# Patient Record
Sex: Female | Born: 1980 | Race: White | Hispanic: No | Marital: Married | Smoking: Former smoker
Health system: Southern US, Community
[De-identification: ages and names within clinical notes are randomized; demographics above are authoritative.]

## PROBLEM LIST (undated history)

## (undated) DIAGNOSIS — I82409 Acute embolism and thrombosis of unspecified deep veins of unspecified lower extremity: Secondary | ICD-10-CM

## (undated) HISTORY — DX: Acute embolism and thrombosis of unspecified deep veins of unspecified lower extremity: I82.409

---

## 2002-11-12 ENCOUNTER — Encounter: Payer: Self-pay | Admitting: Family Medicine

## 2002-11-12 ENCOUNTER — Inpatient Hospital Stay (HOSPITAL_COMMUNITY): Admission: EM | Admit: 2002-11-12 | Discharge: 2002-11-19 | Payer: Self-pay | Admitting: Family Medicine

## 2002-11-12 ENCOUNTER — Encounter: Admission: RE | Admit: 2002-11-12 | Discharge: 2002-11-12 | Payer: Self-pay | Admitting: Family Medicine

## 2002-11-13 ENCOUNTER — Encounter: Payer: Self-pay | Admitting: Family Medicine

## 2003-05-09 ENCOUNTER — Ambulatory Visit (HOSPITAL_COMMUNITY): Admission: RE | Admit: 2003-05-09 | Discharge: 2003-05-09 | Payer: Self-pay | Admitting: Internal Medicine

## 2003-07-01 ENCOUNTER — Ambulatory Visit (HOSPITAL_COMMUNITY): Admission: RE | Admit: 2003-07-01 | Discharge: 2003-07-01 | Payer: Self-pay | Admitting: Family Medicine

## 2009-04-17 HISTORY — PX: APPENDECTOMY: SHX54

## 2009-05-02 ENCOUNTER — Encounter (INDEPENDENT_AMBULATORY_CARE_PROVIDER_SITE_OTHER): Payer: Self-pay | Admitting: General Surgery

## 2009-05-02 ENCOUNTER — Inpatient Hospital Stay (HOSPITAL_COMMUNITY): Admission: EM | Admit: 2009-05-02 | Discharge: 2009-05-10 | Payer: Self-pay | Admitting: Emergency Medicine

## 2010-08-08 ENCOUNTER — Ambulatory Visit (INDEPENDENT_AMBULATORY_CARE_PROVIDER_SITE_OTHER): Payer: 59 | Admitting: Internal Medicine

## 2010-08-08 ENCOUNTER — Encounter: Payer: Self-pay | Admitting: Internal Medicine

## 2010-08-08 DIAGNOSIS — R51 Headache: Secondary | ICD-10-CM | POA: Insufficient documentation

## 2010-08-08 DIAGNOSIS — L659 Nonscarring hair loss, unspecified: Secondary | ICD-10-CM | POA: Insufficient documentation

## 2010-08-08 DIAGNOSIS — R519 Headache, unspecified: Secondary | ICD-10-CM | POA: Insufficient documentation

## 2010-08-08 DIAGNOSIS — R5383 Other fatigue: Secondary | ICD-10-CM

## 2010-08-08 DIAGNOSIS — R5381 Other malaise: Secondary | ICD-10-CM

## 2010-08-08 DIAGNOSIS — R946 Abnormal results of thyroid function studies: Secondary | ICD-10-CM | POA: Insufficient documentation

## 2010-08-08 DIAGNOSIS — G5600 Carpal tunnel syndrome, unspecified upper limb: Secondary | ICD-10-CM | POA: Insufficient documentation

## 2010-08-08 DIAGNOSIS — I82409 Acute embolism and thrombosis of unspecified deep veins of unspecified lower extremity: Secondary | ICD-10-CM

## 2010-08-08 LAB — BASIC METABOLIC PANEL
BUN: 15 mg/dL (ref 6–23)
CO2: 27 mEq/L (ref 19–32)
Calcium: 9.6 mg/dL (ref 8.4–10.5)
Chloride: 106 mEq/L (ref 96–112)
Creatinine, Ser: 0.7 mg/dL (ref 0.4–1.2)
GFR: 108.95 mL/min (ref >60.00)
Potassium: 4.8 mEq/L (ref 3.5–5.1)
Sodium: 142 mEq/L (ref 135–145)

## 2010-08-08 LAB — CONVERTED CEMR LAB
Anti Nuclear Antibody(ANA): NEGATIVE
Vit D, 25-Hydroxy: 39 ng/mL (ref 30–89)

## 2010-08-08 LAB — CBC WITH DIFFERENTIAL/PLATELET
Basophils Absolute: 0 10*3/uL (ref 0.0–0.1)
Basophils Relative: 0.6 % (ref 0.0–3.0)
Eosinophils Absolute: 0.1 10*3/uL (ref 0.0–0.7)
Eosinophils Relative: 1.7 % (ref 0.0–5.0)
Hemoglobin: 13 g/dL (ref 12.0–15.0)
Lymphocytes Relative: 26.3 % (ref 12.0–46.0)
MCHC: 33.9 g/dL (ref 30.0–36.0)
MCV: 91.1 fl (ref 78.0–100.0)
Monocytes Absolute: 0.4 10*3/uL (ref 0.1–1.0)
Neutro Abs: 5.4 10*3/uL (ref 1.4–7.7)
Neutrophils Relative %: 66.5 % (ref 43.0–77.0)
Platelets: 332 10*3/uL (ref 150.0–400.0)
RBC: 4.21 Mil/uL (ref 3.87–5.11)
RDW: 13.4 % (ref 11.5–14.6)

## 2010-08-08 LAB — HEPATIC FUNCTION PANEL
ALT: 14 U/L (ref 0–35)
AST: 18 U/L (ref 0–37)
Albumin: 4.2 g/dL (ref 3.5–5.2)
Alkaline Phosphatase: 55 U/L (ref 39–117)
Bilirubin, Direct: 0.1 mg/dL (ref 0.0–0.3)
Total Bilirubin: 0.6 mg/dL (ref 0.3–1.2)

## 2010-08-08 LAB — VITAMIN B12: Vitamin B-12: 550 pg/mL (ref 211–911)

## 2010-08-08 LAB — TSH: TSH: 1.3 u[IU]/mL (ref 0.35–5.50)

## 2010-08-08 NOTE — Progress Notes (Signed)
  Subjective:    Patient ID: [name], female    DOB: 09/02/1980, 30 y.o.   MRN: [id number]  HPI Pt presents to clinic for evaluation of possible abnormal thyroid tests. Notes several year h/o constellation of sx's including fatigue, cold intolerance, alopecia, dry skin, scalp irritation, irritability and difficulty losing wt. Was evaluated by her gyn who obtained tsh, free t4, t3, ft3 and prolactin. All values nl except low free t4 at 0.82. Pt concerned about possible hypothyroidism.   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 No further thrombotic events. Was taking ocp at the time without tobacco use. No known family h/o clots.  C/o right lateral fingers intermittent numbness and wrist pain without h/o trauma. Uses computer mouse frequently with right hand.  Reviewed PMH, PSH, medications, allergies, social hx and family hx.    Review of Systems  Constitutional: Positive for fatigue. Negative for fever, chills, activity change and unexpected weight change.  HENT: Negative for ear pain, facial swelling and neck pain.   Eyes: Negative for discharge, redness and visual disturbance.  Respiratory: Negative for cough, shortness of breath and wheezing.   Cardiovascular: Negative for chest pain and palpitations.  Gastrointestinal: Positive for constipation. Negative for nausea, abdominal pain and abdominal distention.  Genitourinary: Negative for hematuria, decreased urine volume and difficulty urinating.  Musculoskeletal: Positive for arthralgias. Negative for joint swelling.  Skin: Negative for color change and rash.  Neurological: Positive for numbness. Negative for dizziness, syncope and headaches.  Hematological: Negative for adenopathy. Does not bruise/bleed easily.  Psychiatric/Behavioral: Negative for behavioral problems, confusion and agitation.       Objective:   Physical Exam  [nursing  notereviewed. Constitutional: Vital signs are normal. She appears well-developed and well-nourished. No distress.  HENT:  Head: Normocephalic and atraumatic.  Right Ear: Tympanic membrane, external ear and ear canal normal.  Left Ear: Tympanic membrane, external ear and ear canal normal.  Nose: Nose normal.  Mouth/Throat: Oropharynx is clear and moist. No oropharyngeal exudate.  Eyes: EOM are normal. Pupils are equal, round, and reactive to light. Right eye exhibits no discharge. Left eye exhibits no discharge. No scleral icterus.  Neck: Normal range of motion. Neck supple. No thyromegaly present.  Cardiovascular: Normal rate, regular rhythm and normal heart sounds.  Exam reveals no gallop and no friction rub.   No murmur heard. Pulmonary/Chest: Effort normal and breath sounds normal. No respiratory distress. She has no wheezes. She has no rales.  Abdominal: Soft. Bowel sounds are normal. She exhibits no distension and no mass. There is no hepatosplenomegaly. There is no tenderness. There is no rebound and no guarding.  Musculoskeletal:       FROM right hand/wrist. +phalens right. No thenar muscle wasting  Lymphadenopathy:    She has no cervical adenopathy.  Neurological: She is alert. She displays no tremor. Gait normal.  Skin: Skin is warm. No rash noted. She is not diaphoretic. No erythema.  Psychiatric: She has a normal mood and affect. Her behavior is normal.          Assessment & Plan:

## 2010-08-08 NOTE — Assessment & Plan Note (Signed)
Re-evaluate thyroid. Obtain ANA.

## 2010-08-08 NOTE — Assessment & Plan Note (Signed)
Prescription provided for right wrist splint. Followup if no improvement or worsening.

## 2010-08-08 NOTE — Assessment & Plan Note (Signed)
Repeat TSH with free t4. Pt expresses interest in possible endocrinology consult pending these results

## 2010-08-08 NOTE — Assessment & Plan Note (Signed)
Repeat thyroid evaluation. Obtain cbc, chem7, lft, b12 and vitamin d.

## 2010-08-09 ENCOUNTER — Telehealth: Payer: Self-pay

## 2010-08-09 LAB — VITAMIN D 25 HYDROXY (VIT D DEFICIENCY, FRACTURES): Vit D, 25-Hydroxy: 39 ng/mL (ref 30–89)

## 2010-08-09 LAB — ANA: Anti Nuclear Antibody(ANA): NEGATIVE

## 2010-08-09 NOTE — Telephone Encounter (Signed)
Pt aware. Wants labs mailed to her before making decision on endocrinology consult. Letter sent

## 2010-08-09 NOTE — Telephone Encounter (Signed)
Message copied by Kyung Rudd on Thu Aug 09, 2010 12:55 PM ------      Message from: Letitia Libra, Maisie Fus      Created: Thu Aug 09, 2010 10:06 AM       Labs nl including both thyroid tests. Pt seemed to want endocrinology consult if labs nl. pls check when notify

## 2010-09-19 LAB — BASIC METABOLIC PANEL
BUN: 3 mg/dL — ABNORMAL LOW (ref 6–23)
CO2: 30 mEq/L (ref 19–32)
Chloride: 102 mEq/L (ref 96–112)
GFR calc non Af Amer: >60 mL/min (ref >60)
Glucose, Bld: 109 mg/dL — ABNORMAL HIGH (ref 70–99)
Glucose, Bld: 96 mg/dL (ref 70–99)
Potassium: 3.5 mEq/L (ref 3.5–5.1)
Potassium: 4.7 mEq/L (ref 3.5–5.1)
Sodium: 135 mEq/L (ref 135–145)

## 2010-09-19 LAB — CBC
HCT: 33.7 % — ABNORMAL LOW (ref 36.0–46.0)
HCT: 33.8 % — ABNORMAL LOW (ref 36.0–46.0)
HCT: 41.2 % (ref 36.0–46.0)
Hemoglobin: 11.3 g/dL — ABNORMAL LOW (ref 12.0–15.0)
Hemoglobin: 11.3 g/dL — ABNORMAL LOW (ref 12.0–15.0)
Hemoglobin: 13.8 g/dL (ref 12.0–15.0)
MCHC: 33.5 g/dL (ref 30.0–36.0)
MCHC: 33.5 g/dL (ref 30.0–36.0)
MCV: 89.7 fL (ref 78.0–100.0)
MCV: 89.8 fL (ref 78.0–100.0)
Platelets: 281 10*3/uL (ref 150–400)
Platelets: 287 10*3/uL (ref 150–400)
RBC: 3.76 MIL/uL — ABNORMAL LOW (ref 3.87–5.11)
RBC: 4.59 MIL/uL (ref 3.87–5.11)
RDW: 13.9 % (ref 11.5–15.5)
RDW: 14.6 % (ref 11.5–15.5)
WBC: 13.1 10*3/uL — ABNORMAL HIGH (ref 4.0–10.5)
WBC: 14.3 10*3/uL — ABNORMAL HIGH (ref 4.0–10.5)
WBC: 16.1 10*3/uL — ABNORMAL HIGH (ref 4.0–10.5)

## 2010-09-19 LAB — BODY FLUID CULTURE
Culture: NO GROWTH
Culture: NO GROWTH
Gram Stain: NONE SEEN

## 2010-09-19 LAB — DIFFERENTIAL
Basophils Absolute: 0 10*3/uL (ref 0.0–0.1)
Basophils Relative: 0 % (ref 0–1)
Eosinophils Absolute: 0 10*3/uL (ref 0.0–0.7)
Eosinophils Relative: 0 % (ref 0–5)
Lymphocytes Relative: 6 % — ABNORMAL LOW (ref 12–46)
Lymphs Abs: 1 10*3/uL (ref 0.7–4.0)
Monocytes Absolute: 0.9 10*3/uL (ref 0.1–1.0)
Monocytes Relative: 6 % (ref 3–12)
Neutro Abs: 14.1 10*3/uL — ABNORMAL HIGH (ref 1.7–7.7)
Neutrophils Relative %: 88 % — ABNORMAL HIGH (ref 43–77)

## 2010-09-19 LAB — COMPREHENSIVE METABOLIC PANEL
ALT: 14 U/L (ref 0–35)
ALT: 22 U/L (ref 0–35)
AST: 18 U/L (ref 0–37)
Albumin: 3.9 g/dL (ref 3.5–5.2)
Alkaline Phosphatase: 166 U/L — ABNORMAL HIGH (ref 39–117)
Alkaline Phosphatase: 56 U/L (ref 39–117)
BUN: 7 mg/dL (ref 6–23)
CO2: 28 mEq/L (ref 19–32)
CO2: 30 mEq/L (ref 19–32)
Calcium: 8.3 mg/dL — ABNORMAL LOW (ref 8.4–10.5)
Calcium: 9.5 mg/dL (ref 8.4–10.5)
Chloride: 105 mEq/L (ref 96–112)
Chloride: 98 mEq/L (ref 96–112)
Creatinine, Ser: 0.86 mg/dL (ref 0.4–1.2)
GFR calc Af Amer: >60 mL/min (ref >60)
GFR calc non Af Amer: >60 mL/min (ref >60)
GFR calc non Af Amer: >60 mL/min (ref >60)
Glucose, Bld: 130 mg/dL — ABNORMAL HIGH (ref 70–99)
Glucose, Bld: 99 mg/dL (ref 70–99)
Potassium: 4.2 mEq/L (ref 3.5–5.1)
Potassium: 4.3 mEq/L (ref 3.5–5.1)
Sodium: 137 mEq/L (ref 135–145)
Sodium: 140 mEq/L (ref 135–145)
Total Bilirubin: 0.5 mg/dL (ref 0.3–1.2)
Total Bilirubin: 0.9 mg/dL (ref 0.3–1.2)
Total Protein: 7.9 g/dL (ref 6.0–8.3)

## 2010-09-19 LAB — GC/CHLAMYDIA PROBE AMP, GENITAL
Chlamydia, DNA Probe: NEGATIVE
GC Probe Amp, Genital: NEGATIVE

## 2010-09-19 LAB — WET PREP, GENITAL: Yeast Wet Prep HPF POC: NONE SEEN

## 2010-09-19 LAB — URINALYSIS, ROUTINE W REFLEX MICROSCOPIC
Glucose, UA: NEGATIVE mg/dL
Hgb urine dipstick: NEGATIVE
Nitrite: NEGATIVE
Protein, ur: 30 mg/dL — AB
Specific Gravity, Urine: 1.025 (ref 1.005–1.030)
Urobilinogen, UA: 1 mg/dL (ref 0.0–1.0)
pH: 6 (ref 5.0–8.0)

## 2010-09-19 LAB — URINE MICROSCOPIC-ADD ON

## 2010-09-19 LAB — APTT: aPTT: 40 seconds — ABNORMAL HIGH (ref 24–37)

## 2010-09-19 LAB — ANAEROBIC CULTURE

## 2010-09-19 LAB — LIPASE, BLOOD: Lipase: 16 U/L (ref 11–59)

## 2010-11-02 NOTE — H&P (Signed)
[name], [name]                        ACCOUNT NO.:  [id number]   MEDICAL RECORD NO.:  [id number]                   PATIENT TYPE:  INP   LOCATION:  0372                                 FACILITY:  [facility]   PHYSICIAN:  Gloriajean Dell. Andrey Campanile, M.D.                DATE OF BIRTH:  1999-04-07   DATE OF ADMISSION:  11/12/2002  DATE OF DISCHARGE:                                HISTORY & PHYSICAL   IDENTIFICATION:  A 30 year old single white female from [place].   CHIEF COMPLAINT:  Four days of right back and chest pain and feeling short  winded with exertion.   HISTORY OF PRESENT ILLNESS:  The patient began having sharp pains in her  right posterior and lateral chest worse with a deep breath or cough though  she has not been having a cough or cold symptoms. It is also painful to lie  on that side and she has not slept well for three nights. Last night she had  a low grade fever and went to the office where Dr. Doristine Counter felt she had  gallbladder disease. Labs were drawn and x-rays ordered. Today abdominal  ultrasound was normal. Abdominal CT scan revealed a right common iliac vein  thrombus and right lung effusion and atelectasis. She is admitted now for  probable right lung pulmonary embolism and for further diagnostic workup and  treatment. She has had no recent trauma, surgery, travel or bed rest. She  has not had leg pain or swelling.   PAST MEDICAL HISTORY:  Ten days ago she was seen in the office for left  chest and upper abdominal pain felt to be gas pain, a chest x-ray was  negative. This resolved. Five months ago, she was started Yasmin birth  control pill. She is now in her second week of pack. Three months ago had  Lasik surgery for corrective vision. Had good results. She does not smoke  cigarettes. No excessive alcohol. No history of clotting or bleeding  disorders. She has seasonal allergic rhinitis. Ocular migraines.   ALLERGIES:  CODEINE which causes hives and swelling  in her throat.   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 She lives at home with her parents.   FAMILY HISTORY:  She has one younger sister who is well. Her mother and  grandmother have had severe migraines. Father as well. Mother has a history  of depression.   PHYSICAL EXAMINATION:  GENERAL:  Reveals a well appearing, non-obese, white  female in no distress lying in bed. Her color is normal.  Oxygen saturation  on room air is 99%.  VITAL SIGNS:  Temperature 98.2, pulse 84, respirations 18, blood pressure  133/74, weight 154 pounds.  SKIN:  Normal.  MENTAL STATUS:  Normal.  HEENT:  Negative.  NECK:  Without adenopathy or thyroid enlargement.  LUNGS:  Clear to auscultation and percussion. Percussion is tender on the  right posterior chest and there is also tenderness to palpation there.  HEART:  Regular without murmurs, extra sounds or rubs.  ABDOMEN:  Nontender.  EXTREMITIES:  Normal. There is no peripheral edema and no tenderness of the  legs. Negative Homan sign.  She has good pulses.   LABORATORY DATA:  From the office yesterday, white count 10,700,  differential normal, hemoglobin 12.4, MCV 91, platelets 368K. Pro time INR  9.6/0.9. Comprehensive metabolic profile normal. Urinalysis normal.   Abdominal ultrasound and CT scan see H&P.   ASSESSMENT:  Right common iliac vein thrombus. Pleuritic chest pain and  right back pain and right pleural effusion felt to be pulmonary embolism.  Oral contraceptive use for five months.   PLAN:  Discontinue Yasmin. Draw hypercoagulation panel and then start low  Lovenox and Coumadin. CT scan of chest and legs tomorrow. Hopefully will  discharge soon with self injection of Lovenox to followup in the office for  pro times.                                               Gloriajean Dell. Andrey Campanile, M.D.    FHW/MEDQ  D:   11/12/2002  T:  11/13/2002  Job:  [id number]

## 2010-11-02 NOTE — Discharge Summary (Signed)
NAMEJANNAT, [name]                        ACCOUNT NO.:  [id number]   MEDICAL RECORD NO.:  [id number]                   PATIENT TYPE:  INP   LOCATION:  0372                                 FACILITY:  [facility]   PHYSICIAN:  Rosanne Sack, M.D.         DATE OF BIRTH:  [birth year]   DATE OF ADMISSION:  11/12/2002  DATE OF DISCHARGE:  11/19/2002                                 DISCHARGE SUMMARY   PROBLEM LIST:  1. Bilateral pulmonary emboli associated with a right thigh deep vein     thrombosis.     a. Discharge INR 2.8 on 10 mg of Coumadin.  2. Rule out hypercoagulopathy state.     a. Prothrombin gene mutation and factor leiden-5 mutation pending.     b. On birth control pills until admission.     c. Total protein C 88, functional protein C 1:4 (within normal limits).     d. Total protein S 90, slight decreased functional protein S 67 (normal        range 81 to 180), this value to be repeated once the patient is        finished with Coumadin within a year.   DISCHARGE MEDICATIONS:  Coumadin 10 mg p.o. daily.   PROCEDURE:  CT scan of the chest consistent with bilateral pulmonary emboli  within the lower lobes associated with bibasilar atelectasis changes,  greater in the right than the left, and a small pleural effusion.  This  could be suggestive for possible pulmonary infarctions in the right lower  lobe.   CONSULTATIONS:  None.   DISCHARGE LABORATORY DATA:  INR 2.8.  Hemoglobin 15.2, MCV 88, white blood  cell count 8.1, platelets 383.  AT 398, prothrombin gene mutation and factor  5 leiden mutation pending.  Negative cardiolipin antibodies (IGM and IGG),  total protein C 88, total protein S 90, functional protein S 67, functional  protein C 104.   HOSPITAL COURSE:  [name] is a pleasant 30 year old female admitted to  [facility] on Nov 12, 2002, with a right thigh deep vein  thrombosis associated with right pleuritic chest pain.  Please see admission  H&P by Dr. Benedetto Goad for further details regarding the history of  presentation, physical examination, and laboratory data.  #1 -  BILATERAL PULMONARY EMBOLI ASSOCIATED WITH A RIGHT LOWER EXTREMITY  DEEP VEIN THROMBOSIS:  The patient, as described in the admission H&P, was  admitted with a large thigh deep vein thrombosis associated with pleuritic  chest pain.  The patient was hemodynamically stable, and the pulse  oximetries were within normal limits.  A chest CT scan was obtained to rule  out PE.  This imaging study showed bilateral PEs associated with a right  lower lobe pulmonary infarction.  No risk factors for VTE except for the use  of birth control pills.  The patient got studies to rule out a  hypercoagulability state prior to Coumadin and  Lovenox were started.  Mrs.  Debroah Gentry remained hemodynamically stable and her pulse oximetry remained  completely stable throughout this hospital stay.  The INR increased steadily  without complications.  Lovenox was discontinued on the day of discharge  after having over two days of therapeutic INR with Lovenox.  The current  requirements of Coumadin is about 10 mg a day.  The patient will remain on  Coumadin for a total of 12 months.  The goal INR is between 2 and 3.  Dr.  Andrey Campanile will continuing managing this patient upon discharge.  #2 -  RULE OUT HYPERCOAGULABILITY STATE:  As described in problem #1, only  birth control pill use was the risk factor.  No trauma, surgical procedures,  or long trips in the recent past were reported.  As described in problem #1,  hypercoagulability studies were obtained.  At the time of discharge, the  only two tests pending were the genetic screening (prothrombin gene  mutation, factor leiden-5 mutation).  The rest of the studies were basically  normal except for the functional protein S.  I shared this finding with Dr.  Cyndie Chime.  The results of this test remains nuclear at this point.  Acute  thrombosis can  decrease the functionality of this protein S.  Recommendation  was to repeat the same testing once the patient is off of Coumadin within 12  to 14 months.  The patient was advised to not use birth control pills until  otherwise advised.   DISPOSITION:  The patient is being discharged home to the care of her  parents.  The patient is to take a trip to the [place] within 48 hours.  We  have already coordinated the prothrombin time checked at the location where  this family will stay.  Dr. Benedetto Goad will receive the results of the  prothrombin time.  Dr. Andrey Campanile will get in touch with the family to adjust  the Coumadin as needed.  Information about Coumadin and diet was provided.   CONDITION ON DISCHARGE:  Improved.   I spent about 40 minutes in the discharge process of this patient.                                               Rosanne Sack, M.D.    JM/MEDQ  D:  11/19/2002  T:  11/19/2002  Job:  [id number]   cc:   Gloriajean Dell. Andrey Campanile, M.D.  [id number]  [place]  [place] [address]  Fax: [phone]

## 2010-11-08 IMAGING — CT CT ABDOMEN W/ CM
2 of 4 series · 17 of 46 positions shown, 19 images · IV contrast (agent unspecified)
Comparison: 05/02/2009.

CT ABDOMEN

CLINICAL DATA: Status post laparoscopic surgery 05/02/2009 for
ruptured appendicitis.  Left lower quadrant pain.

CT ABDOMEN AND PELVIS WITH CONTRAST
TECHNIQUE: Multidetector CT imaging of the abdomen and pelvis was
performed using the standard protocol following bolus
administration of intravenous contrast.
Contrast: 125 ml 2mnipaque-WZZ.

[Series 2: abd_pel 5.0 b40f st · axial · 0.72mm/px · z∈[-489,-39]mm · 14 of 98 slices shown, 16 images]
[im 4/98  soft-tissue]
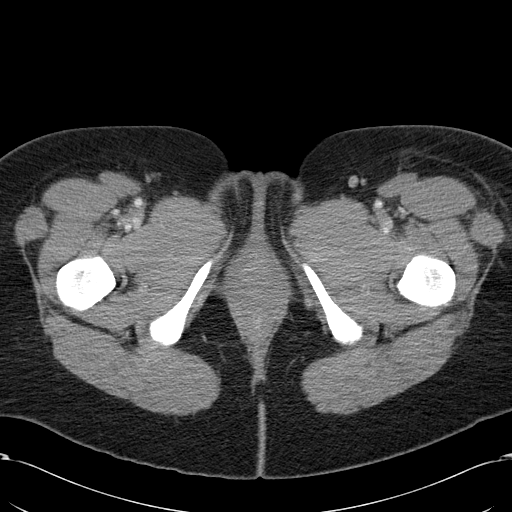
[im 4/98  bone]
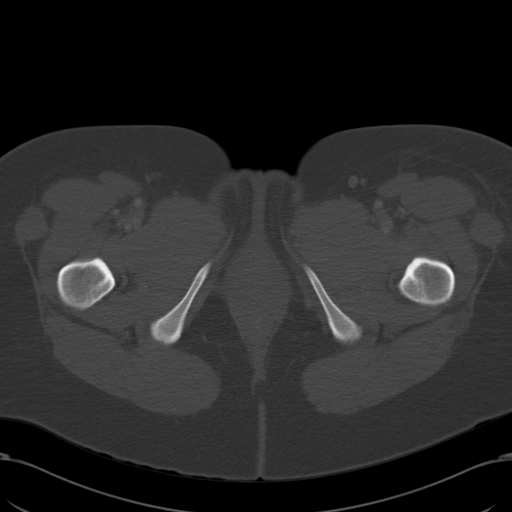
[im 12/98  soft-tissue]
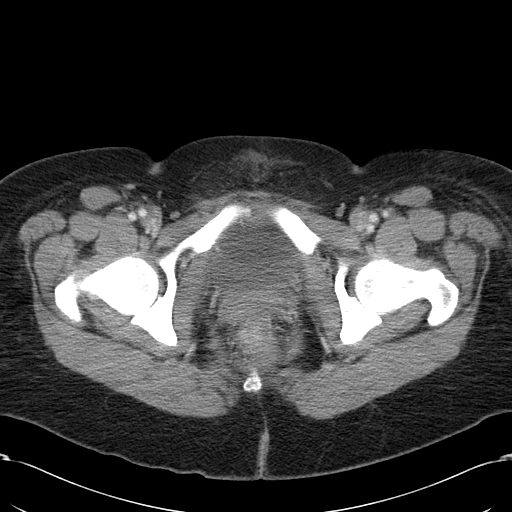
[im 19/98  soft-tissue]
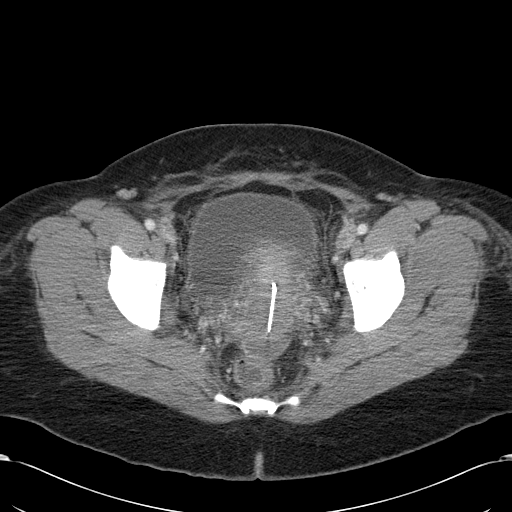
[im 27/98  soft-tissue]
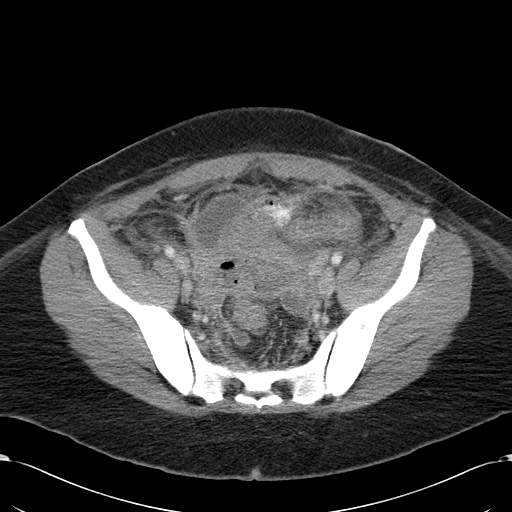
[im 34/98  soft-tissue]
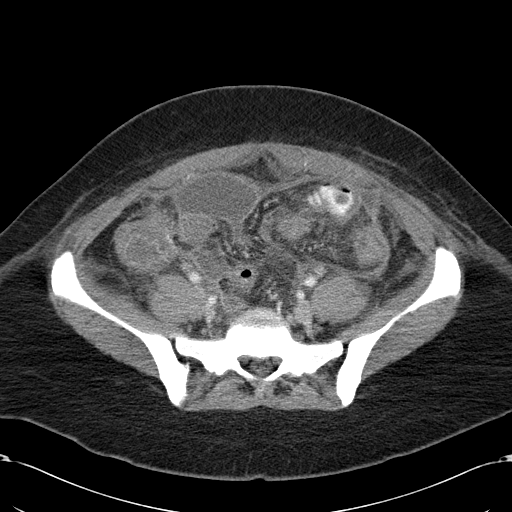
[im 38/98  soft-tissue]
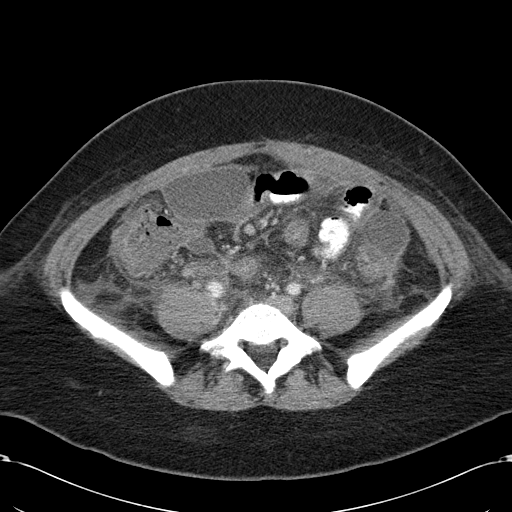
[im 45/98  soft-tissue]
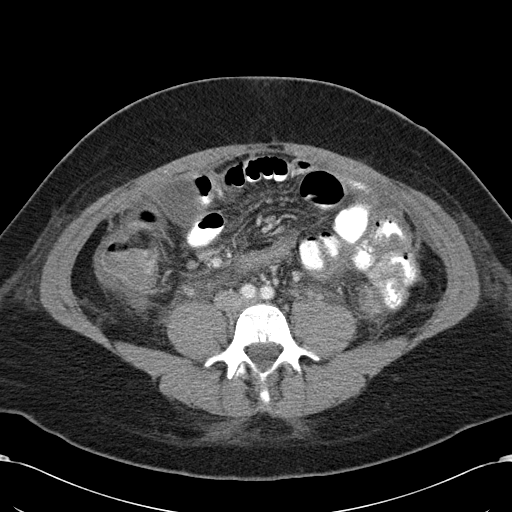
[im 53/98  soft-tissue]
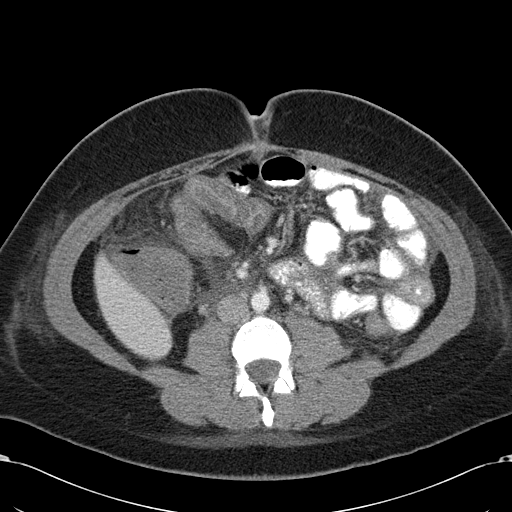
[im 60/98  soft-tissue]
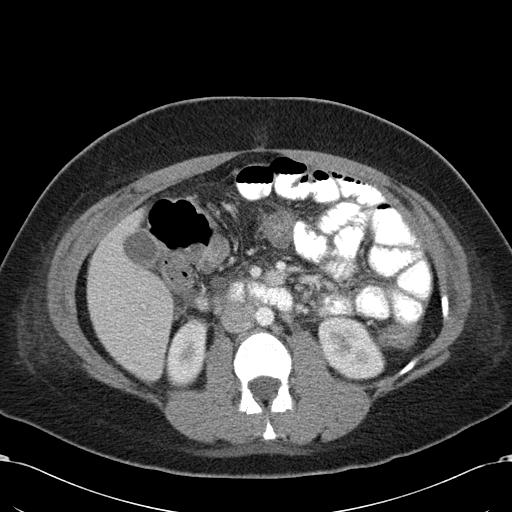
[im 60/98  bone]
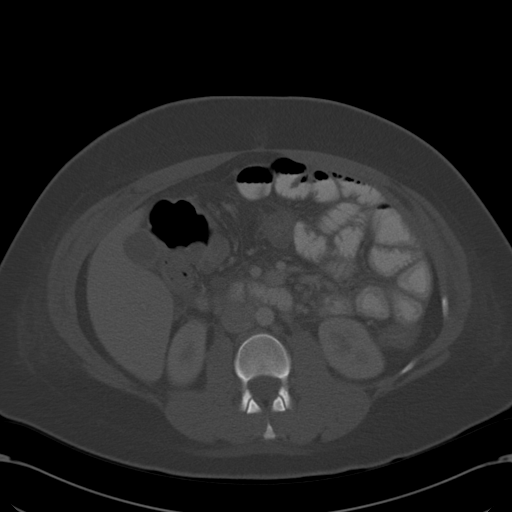
[im 64/98  soft-tissue]
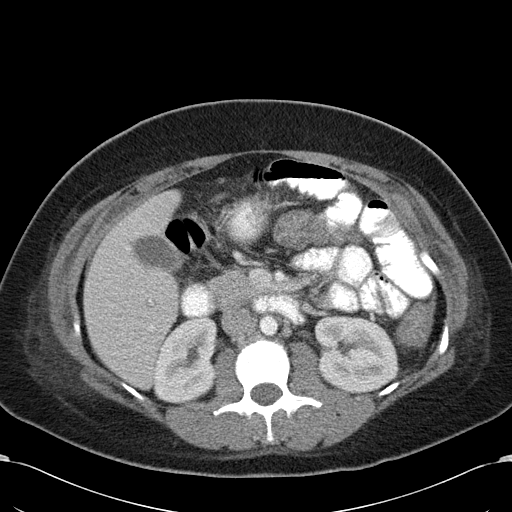
[im 71/98  soft-tissue]
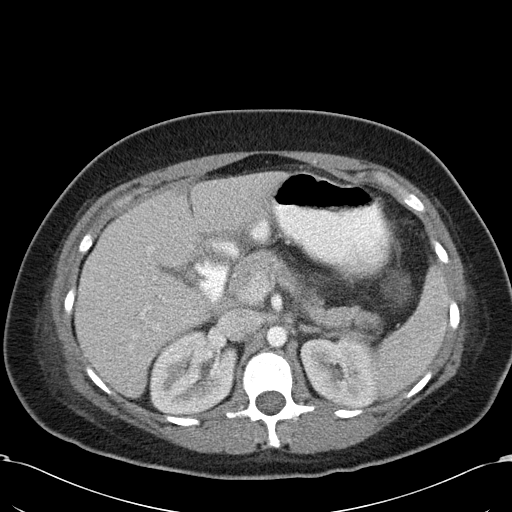
[im 79/98  soft-tissue]
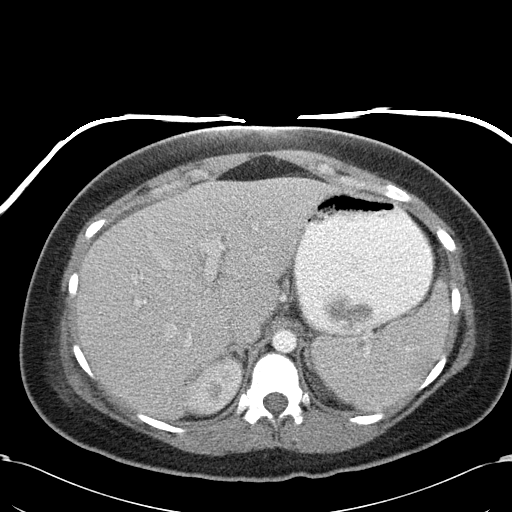
[im 86/98  soft-tissue]
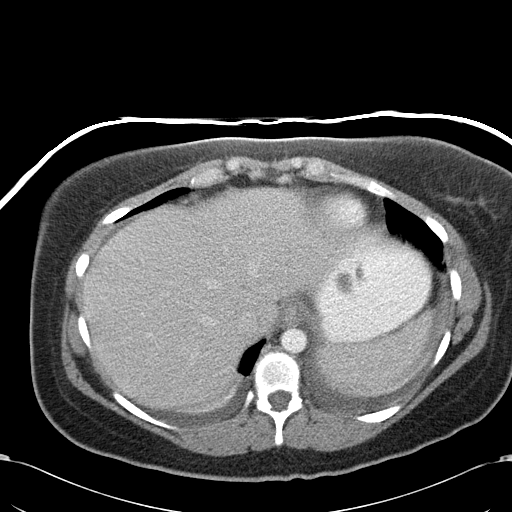
[im 94/98  soft-tissue]
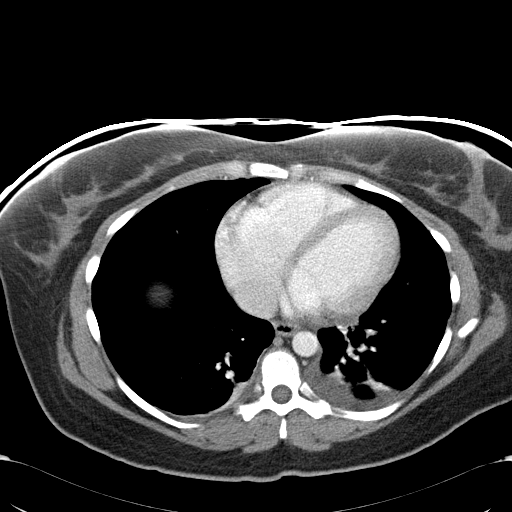

[Series 602: coronal abdomen · coronal · 0.98mm/px · 3 of 117 slices shown]
[im 39/117  soft-tissue]
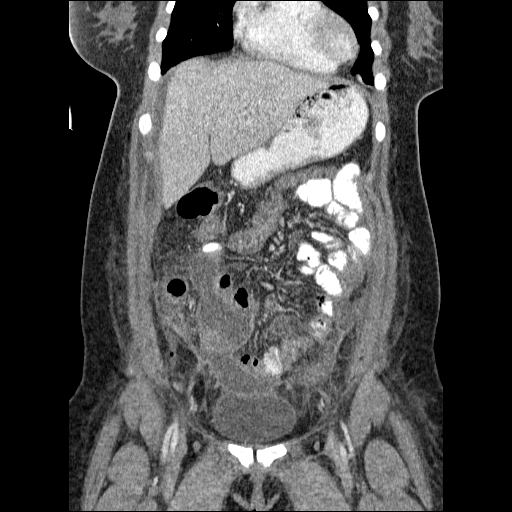
[im 52/117  soft-tissue]
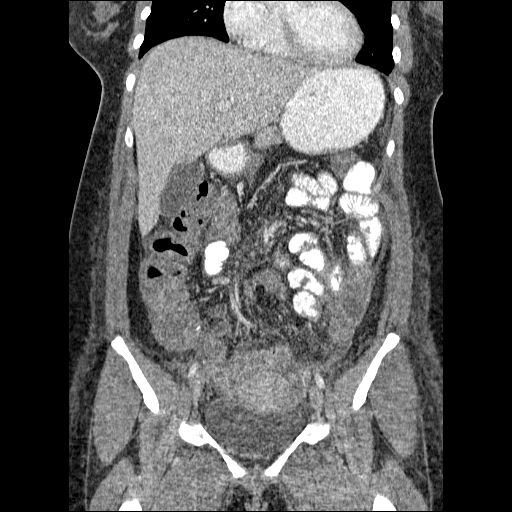
[im 65/117  soft-tissue]
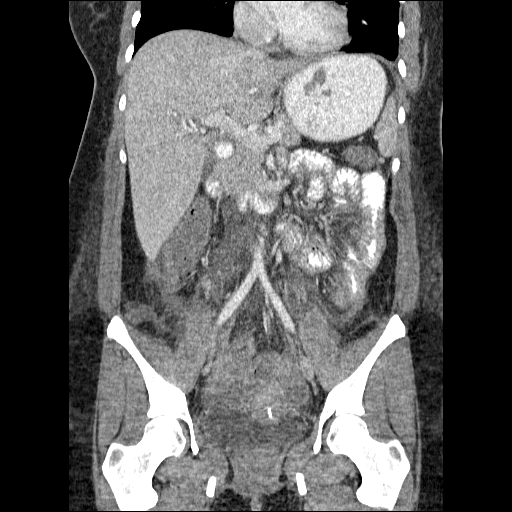

[17 of 46 positions shown; findings below may reference images not displayed]

FINDINGS: Basilar subsegmental atelectasis with small pleural
effusions.  Minimal periportal edema may be related to hydration.
No focal splenic, pancreatic, renal or adrenal lesion.  No
calcified gallstones.  The transverse colon and descending colon
have thickened walls which may be explained by under distension
however, colitis is a possibility.
IMPRESSION: Thickened appearance of transverse colon and descending colon.
Although under distension may partially explain this appearance,
colitis is also a consideration.

CT PELVIS
FINDINGS: Status post appendectomy.  Postoperative fluid
collections.  Largest is located right of midline spanning from the
dome of the bladder superiorly over 15 cm with maximal transverse
dimension and 7 x 4.1 cm.  On the left, fluid collection and just
above and at the level of the iliac crest measures 3.3 x 3.1 x 4
cm.  Smaller complex collections within the deep pelvis.  These
findings are worrisome for development of postoperative abscesses.
IMPRESSION: Postoperative complex fluid collection suspicious for postoperative
abscesses as described above.

This has been made a call report. (Madamad).

## 2010-11-09 IMAGING — CT CT ABCESS DRAINAGE
1 of 2 series · 13 of 32 positions shown, 18 images · non-contrast
Comparison: none

CLINICAL DATA: Appendicitis, postoperative lower abdominal fluid
collections.

[Series 2: rtn ap with st · axial · 0.98mm/px · z∈[-366,-226]mm · 13 of 32 slices shown, 18 images]
[im 2/32  soft-tissue]
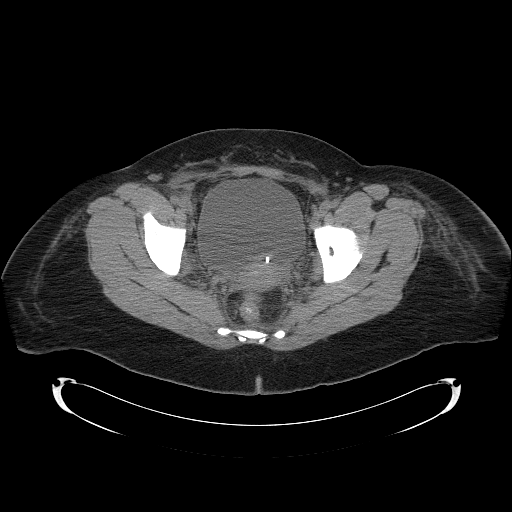
[im 2/32  bone]
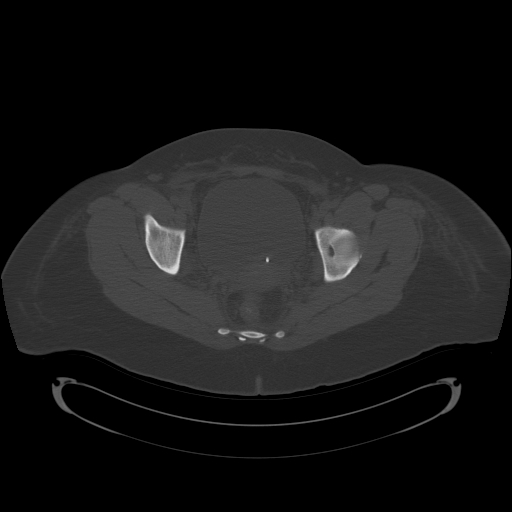
[im 6/32  soft-tissue]
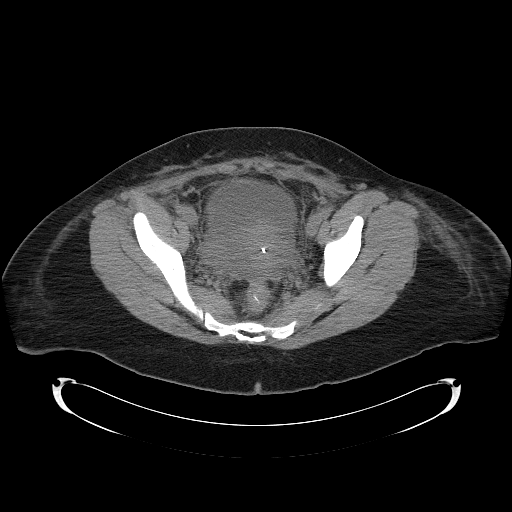
[im 7/32  soft-tissue]
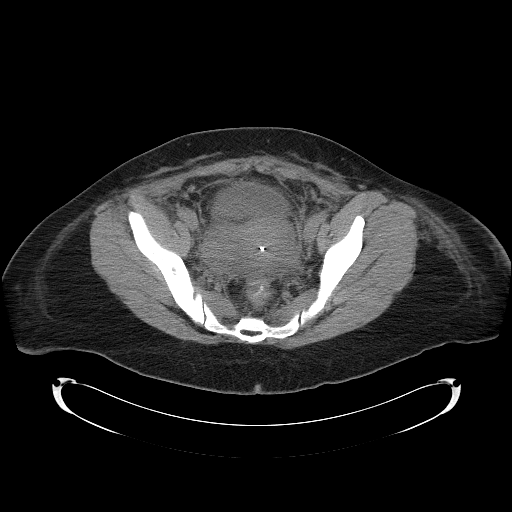
[im 9/32  soft-tissue]
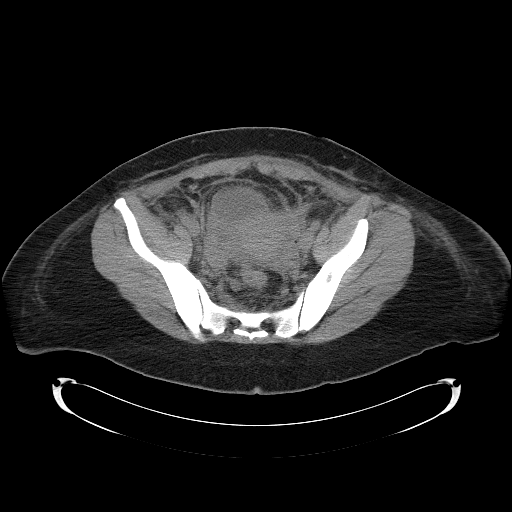
[im 13/32  soft-tissue]
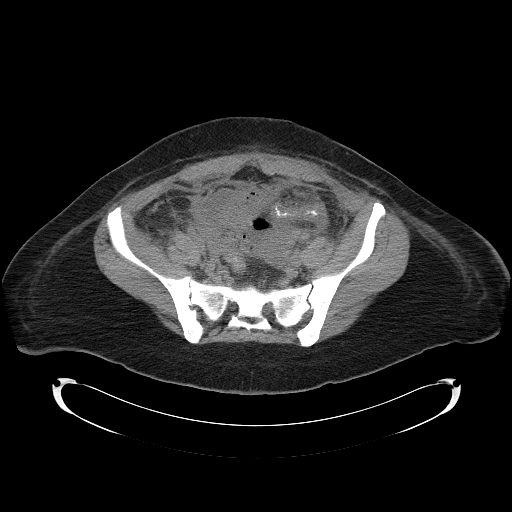
[im 14/32  soft-tissue]
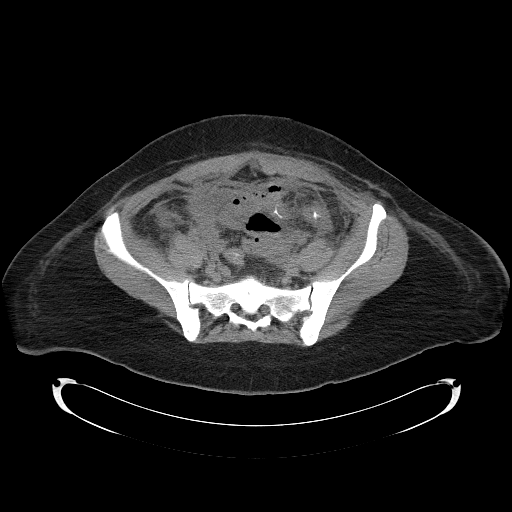
[im 18/32  soft-tissue]
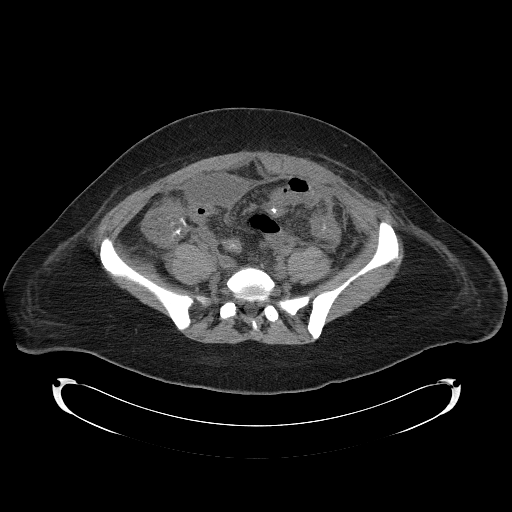
[im 19/32  soft-tissue]
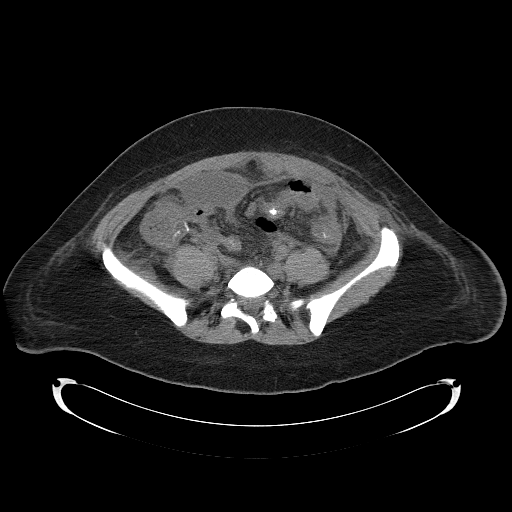
[im 23/32  soft-tissue]
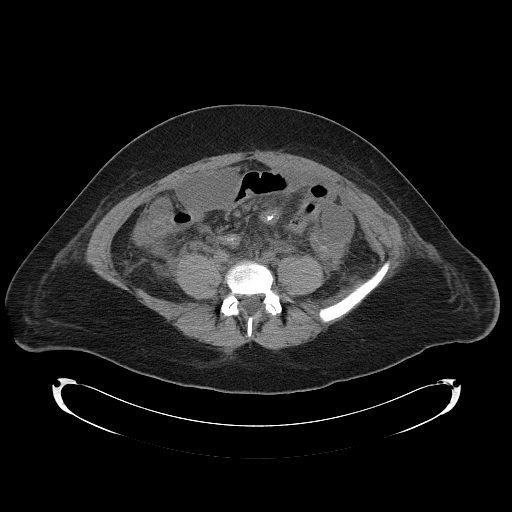
[im 23/32  bone]
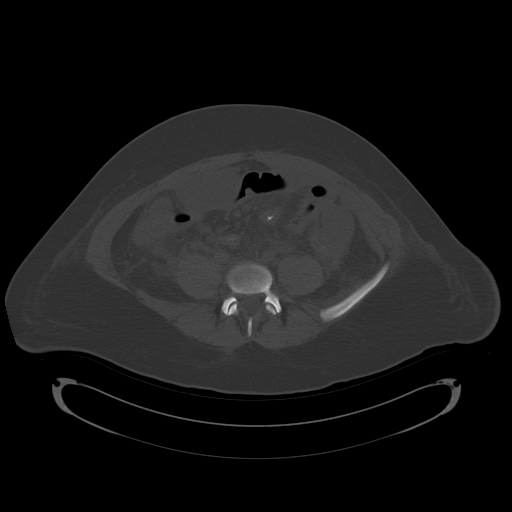
[im 25/32  soft-tissue]
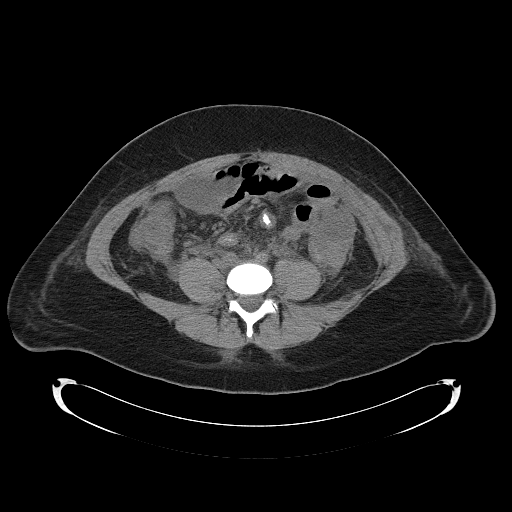
[im 25/32  lung]
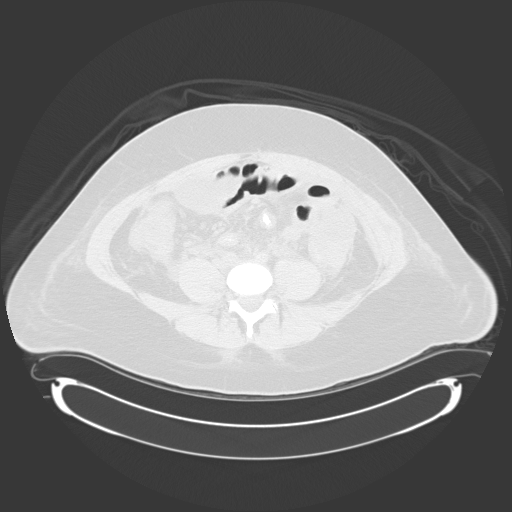
[im 26/32  soft-tissue]
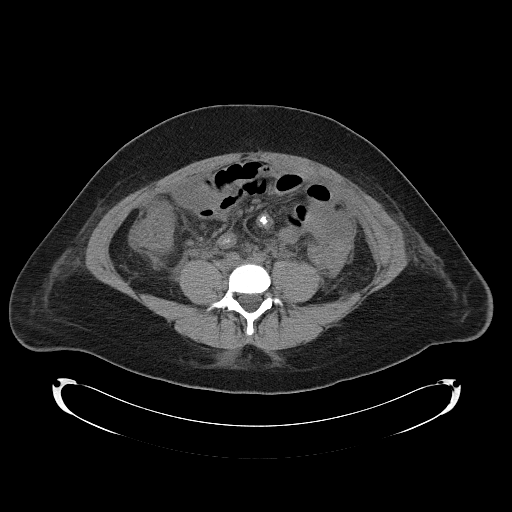
[im 26/32  lung]
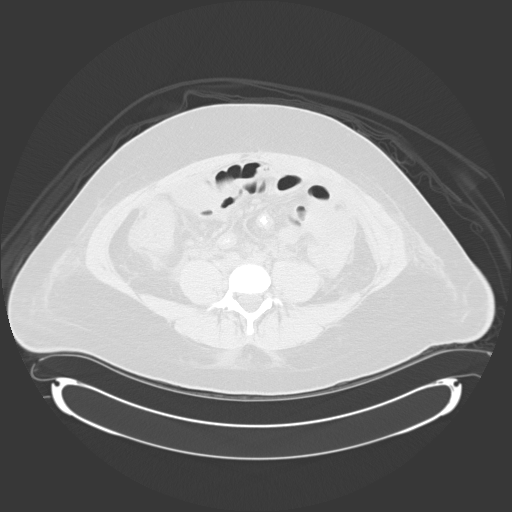
[im 28/32  lung]
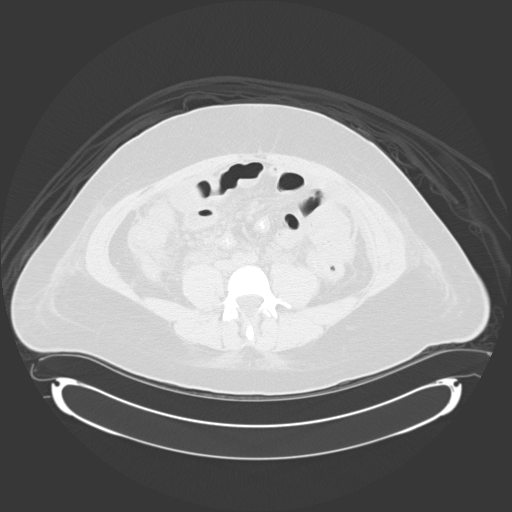
[im 30/32  soft-tissue]
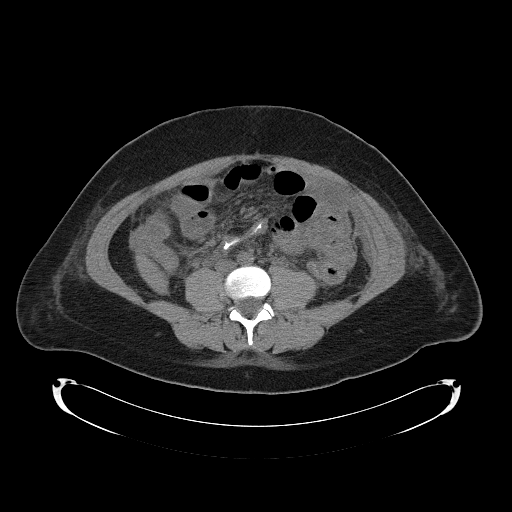
[im 30/32  lung]
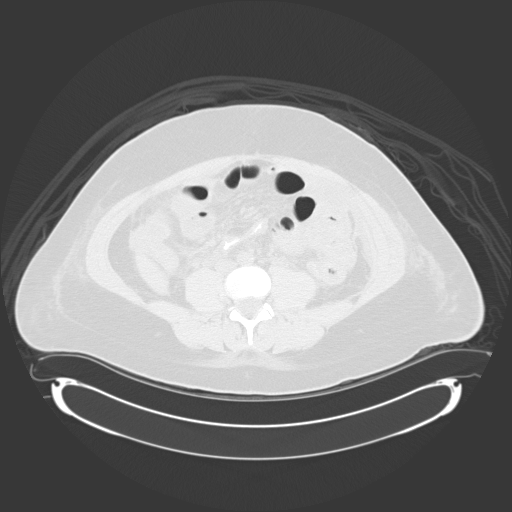

[13 of 32 positions shown; findings below may reference images not displayed]

CT GUIDED RIGHT LOWER QUADRANT FLUID COLLECTION DRAINAGE
CT GUIDED LEFT LOWER QUADRANT FLUID COLLECTION NEEDLE ASPIRATION

Date:  05/08/2009 [DATE]

Radiologist:  Kvng Cipriani, M.D.

Medications:  2 mg Versed, 150 mcg Fentanyl

Guidance:  CT

Sedation time:  30 minutes

Complications:  None.

PROCEDURE/FINDINGS:

Informed consent was obtained from the patient following
explanation of the procedure, risks, benefits and alternatives.
The patient understands, agrees and consents for the procedure.
All questions were addressed.  A time out was performed.

Maximal barrier sterile technique utilized including caps, mask,
sterile gowns, sterile gloves, large sterile drape, hand hygiene,
and betadine

Previous CT scan was reviewed demonstrating bilateral lower
abdominal/pelvic fluid collections, larger on the right.

The patient was positioned supine.  Noncontrast localization CT was
performed.  The fluid collections were localized.  Initially the
right lower quadrant fluid collection procedure was performed.

Under sterile conditions and local anesthesia, an 18 gauge 10 cm
access needle was advanced from an anterior oblique approach.
Needle position was confirmed in the right lower quadrant anterior
fluid collection.  Syringe aspiration yielded yellow serosanguinous
fluid.  Because of the fluid collection size, an Amplatz guide wire
was inserted followed by tract dilatation to advance a 10-French
drain.  Syringe aspiration yielded 50 ml of serosanguinous fluid.
Catheter secured with a Prolene suture and connected to external
suction bulb.  Sterile dressing applied.  No immediate
complication.  The patient tolerated the procedure well.

In a similar fashion, the left lower quadrant smaller collection
was relocalized.  Under sterile conditions and local anesthesia, an
18 gauge 10 cm access needle was advanced from an anterior oblique
approach into the left lower quadrant fluid collection.  Needle
position was confirmed with CT.  Syringe aspiration yielded 10 ml
of serosanguinous blood tinged fluid. No exudative component.  This
small collection was only needle aspirated.  Sample also sent for
Gram stain and culture.
IMPRESSION: Successful right lower quadrant larger fluid collection drainage
procedure with insertion of a 10-French drain.

Successful left lower quadrant fluid collection needle aspiration
only

Samples sent from both sides for Gram stain and culture.

## 2012-11-26 ENCOUNTER — Other Ambulatory Visit: Payer: Self-pay | Admitting: Obstetrics and Gynecology

## 2013-09-17 ENCOUNTER — Emergency Department (HOSPITAL_COMMUNITY)
Admission: EM | Admit: 2013-09-17 | Discharge: 2013-09-17 | Disposition: A | Discharge status: Home / Self Care | Payer: BC Managed Care – PPO | Attending: Emergency Medicine | Admitting: Emergency Medicine

## 2013-09-17 ENCOUNTER — Emergency Department (HOSPITAL_COMMUNITY): Payer: BC Managed Care – PPO

## 2013-09-17 ENCOUNTER — Encounter (HOSPITAL_COMMUNITY): Payer: Self-pay | Admitting: Emergency Medicine

## 2013-09-17 DIAGNOSIS — Y9389 Activity, other specified: Secondary | ICD-10-CM | POA: Insufficient documentation

## 2013-09-17 DIAGNOSIS — S3981XA Other specified injuries of abdomen, initial encounter: Secondary | ICD-10-CM | POA: Insufficient documentation

## 2013-09-17 DIAGNOSIS — Z9089 Acquired absence of other organs: Secondary | ICD-10-CM | POA: Insufficient documentation

## 2013-09-17 DIAGNOSIS — R1032 Left lower quadrant pain: Secondary | ICD-10-CM | POA: Insufficient documentation

## 2013-09-17 DIAGNOSIS — R109 Unspecified abdominal pain: Secondary | ICD-10-CM

## 2013-09-17 DIAGNOSIS — Z87891 Personal history of nicotine dependence: Secondary | ICD-10-CM | POA: Insufficient documentation

## 2013-09-17 DIAGNOSIS — S46909A Unspecified injury of unspecified muscle, fascia and tendon at shoulder and upper arm level, unspecified arm, initial encounter: Secondary | ICD-10-CM | POA: Insufficient documentation

## 2013-09-17 DIAGNOSIS — IMO0002 Reserved for concepts with insufficient information to code with codable children: Secondary | ICD-10-CM | POA: Insufficient documentation

## 2013-09-17 DIAGNOSIS — Z86718 Personal history of other venous thrombosis and embolism: Secondary | ICD-10-CM | POA: Insufficient documentation

## 2013-09-17 DIAGNOSIS — Y92410 Unspecified street and highway as the place of occurrence of the external cause: Secondary | ICD-10-CM | POA: Insufficient documentation

## 2013-09-17 DIAGNOSIS — S66919A Strain of unspecified muscle, fascia and tendon at wrist and hand level, unspecified hand, initial encounter: Secondary | ICD-10-CM

## 2013-09-17 DIAGNOSIS — Z3202 Encounter for pregnancy test, result negative: Secondary | ICD-10-CM | POA: Insufficient documentation

## 2013-09-17 DIAGNOSIS — Z79899 Other long term (current) drug therapy: Secondary | ICD-10-CM | POA: Insufficient documentation

## 2013-09-17 DIAGNOSIS — S63509A Unspecified sprain of unspecified wrist, initial encounter: Secondary | ICD-10-CM | POA: Insufficient documentation

## 2013-09-17 DIAGNOSIS — S4980XA Other specified injuries of shoulder and upper arm, unspecified arm, initial encounter: Secondary | ICD-10-CM | POA: Insufficient documentation

## 2013-09-17 LAB — POC URINE PREG, ED: Preg Test, Ur: NEGATIVE

## 2013-09-17 MED ORDER — IBUPROFEN 800 MG PO TABS: 800.0000 mg | ORAL_TABLET | Freq: Three times a day (TID) | ORAL | Status: AC

## 2013-09-17 MED ORDER — CYCLOBENZAPRINE HCL 10 MG PO TABS: 10.0000 mg | ORAL_TABLET | Freq: Two times a day (BID) | ORAL | Status: AC | PRN

## 2013-09-17 MED ORDER — HYDROCODONE-ACETAMINOPHEN 5-325 MG PO TABS: 1.0000 | ORAL_TABLET | ORAL | Status: AC | PRN

## 2013-09-17 MED ORDER — IOHEXOL 300 MG/ML  SOLN
100.0000 mL | Freq: Once | INTRAMUSCULAR | Status: AC | PRN
  Administered 2013-09-17: 100 mL via INTRAVENOUS
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
## 2013-09-18 NOTE — ED Provider Notes (Signed)
Medical screening examination/treatment/procedure(s) were performed by non-physician practitioner and as supervising physician I was immediately available for consultation/collaboration.   Megan E Docherty, MD 09/18/13 1042 

## 2013-09-27 ENCOUNTER — Other Ambulatory Visit: Payer: Self-pay | Admitting: Chiropractic Medicine

## 2013-09-27 ENCOUNTER — Ambulatory Visit
Admission: RE | Admit: 2013-09-27 | Discharge: 2013-09-27 | Disposition: A | Discharge status: Home / Self Care | Payer: BC Managed Care – PPO | Source: Ambulatory Visit | Attending: Chiropractic Medicine | Admitting: Chiropractic Medicine

## 2013-09-27 DIAGNOSIS — M542 Cervicalgia: Secondary | ICD-10-CM

## 2013-12-06 ENCOUNTER — Other Ambulatory Visit: Payer: Self-pay | Admitting: Obstetrics and Gynecology

## 2013-12-07 LAB — CYTOLOGY - PAP

## 2014-07-06 ENCOUNTER — Encounter: Payer: Self-pay | Admitting: Internal Medicine

## 2015-02-09 ENCOUNTER — Other Ambulatory Visit: Payer: Self-pay | Admitting: Obstetrics and Gynecology

## 2015-02-10 LAB — CYTOLOGY - PAP

## 2016-04-16 ENCOUNTER — Other Ambulatory Visit: Payer: Self-pay | Admitting: Obstetrics and Gynecology

## 2016-04-17 LAB — CYTOLOGY - PAP

## 2021-11-29 ENCOUNTER — Other Ambulatory Visit: Payer: Self-pay | Admitting: Obstetrics and Gynecology

## 2021-11-29 DIAGNOSIS — R928 Other abnormal and inconclusive findings on diagnostic imaging of breast: Secondary | ICD-10-CM

## 2021-12-06 ENCOUNTER — Other Ambulatory Visit: Payer: Self-pay | Admitting: Obstetrics and Gynecology

## 2021-12-06 ENCOUNTER — Ambulatory Visit
Admission: RE | Admit: 2021-12-06 | Discharge: 2021-12-06 | Disposition: A | Discharge status: Home / Self Care | Payer: Self-pay | Source: Ambulatory Visit | Attending: Obstetrics and Gynecology | Admitting: Obstetrics and Gynecology

## 2021-12-06 ENCOUNTER — Other Ambulatory Visit: Payer: BC Managed Care – PPO

## 2021-12-06 ENCOUNTER — Ambulatory Visit
Admission: RE | Admit: 2021-12-06 | Discharge: 2021-12-06 | Disposition: A | Discharge status: Home / Self Care | Payer: BC Managed Care – PPO | Source: Ambulatory Visit | Attending: Obstetrics and Gynecology | Admitting: Obstetrics and Gynecology

## 2021-12-06 DIAGNOSIS — R928 Other abnormal and inconclusive findings on diagnostic imaging of breast: Secondary | ICD-10-CM

## 2021-12-06 DIAGNOSIS — R921 Mammographic calcification found on diagnostic imaging of breast: Secondary | ICD-10-CM

## 2021-12-12 ENCOUNTER — Ambulatory Visit
Admission: RE | Admit: 2021-12-12 | Discharge: 2021-12-12 | Disposition: A | Discharge status: Home / Self Care | Payer: BC Managed Care – PPO | Source: Ambulatory Visit | Attending: Obstetrics and Gynecology | Admitting: Obstetrics and Gynecology

## 2021-12-12 DIAGNOSIS — R921 Mammographic calcification found on diagnostic imaging of breast: Secondary | ICD-10-CM

## 2022-01-18 ENCOUNTER — Other Ambulatory Visit: Payer: Self-pay | Admitting: Obstetrics and Gynecology

## 2023-05-20 ENCOUNTER — Encounter: Payer: BC Managed Care – PPO | Admitting: Bariatrics

## 2023-07-18 ENCOUNTER — Encounter: Payer: Self-pay | Admitting: Nurse Practitioner

## 2023-07-22 ENCOUNTER — Encounter: Payer: BC Managed Care – PPO | Admitting: Nurse Practitioner

## 2024-01-02 ENCOUNTER — Encounter: Payer: Self-pay | Admitting: Advanced Practice Midwife
